# Patient Record
Sex: Male | Born: 1945 | Race: White | Hispanic: No | Marital: Single | Smoking: Former smoker
Health system: Southern US, Community
[De-identification: ages and names within clinical notes are randomized; demographics above are authoritative.]

## PROBLEM LIST (undated history)

## (undated) ENCOUNTER — Emergency Department (HOSPITAL_BASED_OUTPATIENT_CLINIC_OR_DEPARTMENT_OTHER): Admission: EM | Payer: Medicare HMO

## (undated) DIAGNOSIS — N200 Calculus of kidney: Secondary | ICD-10-CM

## (undated) DIAGNOSIS — I10 Essential (primary) hypertension: Secondary | ICD-10-CM

## (undated) DIAGNOSIS — E785 Hyperlipidemia, unspecified: Secondary | ICD-10-CM

## (undated) HISTORY — PX: EYE SURGERY: SHX253

---

## 1999-10-06 ENCOUNTER — Encounter: Admission: RE | Admit: 1999-10-06 | Discharge: 1999-10-06 | Payer: Self-pay | Admitting: Family Medicine

## 1999-10-06 ENCOUNTER — Encounter: Payer: Self-pay | Admitting: Family Medicine

## 2014-10-02 DIAGNOSIS — Z85820 Personal history of malignant melanoma of skin: Secondary | ICD-10-CM | POA: Diagnosis not present

## 2014-10-02 DIAGNOSIS — Z0000 Encounter for general adult medical examination without abnormal findings: Secondary | ICD-10-CM | POA: Diagnosis not present

## 2014-10-02 DIAGNOSIS — Z125 Encounter for screening for malignant neoplasm of prostate: Secondary | ICD-10-CM | POA: Diagnosis not present

## 2014-10-02 DIAGNOSIS — E669 Obesity, unspecified: Secondary | ICD-10-CM | POA: Diagnosis not present

## 2014-10-02 DIAGNOSIS — I10 Essential (primary) hypertension: Secondary | ICD-10-CM | POA: Diagnosis not present

## 2014-10-16 DIAGNOSIS — L570 Actinic keratosis: Secondary | ICD-10-CM | POA: Diagnosis not present

## 2014-10-16 DIAGNOSIS — Z85820 Personal history of malignant melanoma of skin: Secondary | ICD-10-CM | POA: Diagnosis not present

## 2014-10-16 DIAGNOSIS — Z08 Encounter for follow-up examination after completed treatment for malignant neoplasm: Secondary | ICD-10-CM | POA: Diagnosis not present

## 2014-12-05 DIAGNOSIS — R05 Cough: Secondary | ICD-10-CM | POA: Diagnosis not present

## 2015-02-10 DIAGNOSIS — D0421 Carcinoma in situ of skin of right ear and external auricular canal: Secondary | ICD-10-CM | POA: Diagnosis not present

## 2015-02-10 DIAGNOSIS — D0439 Carcinoma in situ of skin of other parts of face: Secondary | ICD-10-CM | POA: Diagnosis not present

## 2015-02-10 DIAGNOSIS — L570 Actinic keratosis: Secondary | ICD-10-CM | POA: Diagnosis not present

## 2015-02-10 DIAGNOSIS — Z08 Encounter for follow-up examination after completed treatment for malignant neoplasm: Secondary | ICD-10-CM | POA: Diagnosis not present

## 2015-02-10 DIAGNOSIS — Z85828 Personal history of other malignant neoplasm of skin: Secondary | ICD-10-CM | POA: Diagnosis not present

## 2015-02-26 DIAGNOSIS — K5730 Diverticulosis of large intestine without perforation or abscess without bleeding: Secondary | ICD-10-CM | POA: Diagnosis not present

## 2015-02-26 DIAGNOSIS — D122 Benign neoplasm of ascending colon: Secondary | ICD-10-CM | POA: Diagnosis not present

## 2015-02-26 DIAGNOSIS — D126 Benign neoplasm of colon, unspecified: Secondary | ICD-10-CM | POA: Diagnosis not present

## 2015-02-26 DIAGNOSIS — Z09 Encounter for follow-up examination after completed treatment for conditions other than malignant neoplasm: Secondary | ICD-10-CM | POA: Diagnosis not present

## 2015-02-26 DIAGNOSIS — Z86010 Personal history of colonic polyps: Secondary | ICD-10-CM | POA: Diagnosis not present

## 2015-02-26 DIAGNOSIS — D123 Benign neoplasm of transverse colon: Secondary | ICD-10-CM | POA: Diagnosis not present

## 2015-06-11 DIAGNOSIS — L570 Actinic keratosis: Secondary | ICD-10-CM | POA: Diagnosis not present

## 2015-06-11 DIAGNOSIS — Z08 Encounter for follow-up examination after completed treatment for malignant neoplasm: Secondary | ICD-10-CM | POA: Diagnosis not present

## 2015-06-11 DIAGNOSIS — L820 Inflamed seborrheic keratosis: Secondary | ICD-10-CM | POA: Diagnosis not present

## 2015-06-11 DIAGNOSIS — Z85828 Personal history of other malignant neoplasm of skin: Secondary | ICD-10-CM | POA: Diagnosis not present

## 2015-09-10 DIAGNOSIS — M47812 Spondylosis without myelopathy or radiculopathy, cervical region: Secondary | ICD-10-CM | POA: Diagnosis not present

## 2015-09-10 DIAGNOSIS — M4692 Unspecified inflammatory spondylopathy, cervical region: Secondary | ICD-10-CM | POA: Diagnosis not present

## 2015-09-10 DIAGNOSIS — S46812A Strain of other muscles, fascia and tendons at shoulder and upper arm level, left arm, initial encounter: Secondary | ICD-10-CM | POA: Diagnosis not present

## 2015-09-10 DIAGNOSIS — S46811A Strain of other muscles, fascia and tendons at shoulder and upper arm level, right arm, initial encounter: Secondary | ICD-10-CM | POA: Diagnosis not present

## 2015-09-10 DIAGNOSIS — M542 Cervicalgia: Secondary | ICD-10-CM | POA: Diagnosis not present

## 2015-09-21 DIAGNOSIS — I10 Essential (primary) hypertension: Secondary | ICD-10-CM | POA: Diagnosis not present

## 2015-09-21 DIAGNOSIS — S161XXA Strain of muscle, fascia and tendon at neck level, initial encounter: Secondary | ICD-10-CM | POA: Diagnosis not present

## 2015-09-30 DIAGNOSIS — M5412 Radiculopathy, cervical region: Secondary | ICD-10-CM | POA: Diagnosis not present

## 2015-10-01 DIAGNOSIS — D485 Neoplasm of uncertain behavior of skin: Secondary | ICD-10-CM | POA: Diagnosis not present

## 2015-10-01 DIAGNOSIS — L820 Inflamed seborrheic keratosis: Secondary | ICD-10-CM | POA: Diagnosis not present

## 2015-10-06 ENCOUNTER — Other Ambulatory Visit (HOSPITAL_BASED_OUTPATIENT_CLINIC_OR_DEPARTMENT_OTHER): Payer: Self-pay | Admitting: Family Medicine

## 2015-10-06 DIAGNOSIS — M5412 Radiculopathy, cervical region: Secondary | ICD-10-CM

## 2015-10-11 ENCOUNTER — Ambulatory Visit (HOSPITAL_BASED_OUTPATIENT_CLINIC_OR_DEPARTMENT_OTHER)
Admission: RE | Admit: 2015-10-11 | Discharge: 2015-10-11 | Disposition: A | Discharge status: Home / Self Care | Payer: Commercial Managed Care - HMO | Source: Ambulatory Visit | Attending: Family Medicine | Admitting: Family Medicine

## 2015-10-11 DIAGNOSIS — M4802 Spinal stenosis, cervical region: Secondary | ICD-10-CM | POA: Insufficient documentation

## 2015-10-11 DIAGNOSIS — M79601 Pain in right arm: Secondary | ICD-10-CM | POA: Insufficient documentation

## 2015-10-11 DIAGNOSIS — R531 Weakness: Secondary | ICD-10-CM | POA: Insufficient documentation

## 2015-10-11 DIAGNOSIS — M5412 Radiculopathy, cervical region: Secondary | ICD-10-CM | POA: Insufficient documentation

## 2015-10-11 DIAGNOSIS — M50322 Other cervical disc degeneration at C5-C6 level: Secondary | ICD-10-CM | POA: Insufficient documentation

## 2016-02-18 DIAGNOSIS — M25532 Pain in left wrist: Secondary | ICD-10-CM | POA: Diagnosis not present

## 2016-02-22 DIAGNOSIS — M25532 Pain in left wrist: Secondary | ICD-10-CM | POA: Diagnosis not present

## 2016-03-03 DIAGNOSIS — Z08 Encounter for follow-up examination after completed treatment for malignant neoplasm: Secondary | ICD-10-CM | POA: Diagnosis not present

## 2016-03-03 DIAGNOSIS — L570 Actinic keratosis: Secondary | ICD-10-CM | POA: Diagnosis not present

## 2016-03-03 DIAGNOSIS — Z85820 Personal history of malignant melanoma of skin: Secondary | ICD-10-CM | POA: Diagnosis not present

## 2016-08-10 DIAGNOSIS — Z23 Encounter for immunization: Secondary | ICD-10-CM | POA: Diagnosis not present

## 2016-08-10 DIAGNOSIS — Z1211 Encounter for screening for malignant neoplasm of colon: Secondary | ICD-10-CM | POA: Diagnosis not present

## 2016-08-10 DIAGNOSIS — E669 Obesity, unspecified: Secondary | ICD-10-CM | POA: Diagnosis not present

## 2016-08-10 DIAGNOSIS — Z125 Encounter for screening for malignant neoplasm of prostate: Secondary | ICD-10-CM | POA: Diagnosis not present

## 2016-08-10 DIAGNOSIS — I10 Essential (primary) hypertension: Secondary | ICD-10-CM | POA: Diagnosis not present

## 2016-09-06 DIAGNOSIS — Z08 Encounter for follow-up examination after completed treatment for malignant neoplasm: Secondary | ICD-10-CM | POA: Diagnosis not present

## 2016-09-06 DIAGNOSIS — L570 Actinic keratosis: Secondary | ICD-10-CM | POA: Diagnosis not present

## 2016-09-06 DIAGNOSIS — Z85820 Personal history of malignant melanoma of skin: Secondary | ICD-10-CM | POA: Diagnosis not present

## 2016-10-20 DIAGNOSIS — J069 Acute upper respiratory infection, unspecified: Secondary | ICD-10-CM | POA: Diagnosis not present

## 2016-10-20 DIAGNOSIS — R05 Cough: Secondary | ICD-10-CM | POA: Diagnosis not present

## 2016-12-19 DIAGNOSIS — M25552 Pain in left hip: Secondary | ICD-10-CM | POA: Diagnosis not present

## 2016-12-19 DIAGNOSIS — M5489 Other dorsalgia: Secondary | ICD-10-CM | POA: Diagnosis not present

## 2016-12-19 DIAGNOSIS — M545 Low back pain: Secondary | ICD-10-CM | POA: Diagnosis not present

## 2017-01-23 DIAGNOSIS — Z0000 Encounter for general adult medical examination without abnormal findings: Secondary | ICD-10-CM | POA: Diagnosis not present

## 2017-01-23 DIAGNOSIS — S80862A Insect bite (nonvenomous), left lower leg, initial encounter: Secondary | ICD-10-CM | POA: Diagnosis not present

## 2017-02-09 DIAGNOSIS — I10 Essential (primary) hypertension: Secondary | ICD-10-CM | POA: Diagnosis not present

## 2017-02-09 DIAGNOSIS — S80862A Insect bite (nonvenomous), left lower leg, initial encounter: Secondary | ICD-10-CM | POA: Diagnosis not present

## 2017-02-09 DIAGNOSIS — R42 Dizziness and giddiness: Secondary | ICD-10-CM | POA: Diagnosis not present

## 2017-02-17 DIAGNOSIS — H5203 Hypermetropia, bilateral: Secondary | ICD-10-CM | POA: Diagnosis not present

## 2017-03-21 DIAGNOSIS — Z08 Encounter for follow-up examination after completed treatment for malignant neoplasm: Secondary | ICD-10-CM | POA: Diagnosis not present

## 2017-03-21 DIAGNOSIS — Z85820 Personal history of malignant melanoma of skin: Secondary | ICD-10-CM | POA: Diagnosis not present

## 2017-03-21 DIAGNOSIS — L570 Actinic keratosis: Secondary | ICD-10-CM | POA: Diagnosis not present

## 2017-04-01 DIAGNOSIS — L237 Allergic contact dermatitis due to plants, except food: Secondary | ICD-10-CM | POA: Diagnosis not present

## 2017-05-05 DIAGNOSIS — H353131 Nonexudative age-related macular degeneration, bilateral, early dry stage: Secondary | ICD-10-CM | POA: Diagnosis not present

## 2017-05-05 DIAGNOSIS — H353130 Nonexudative age-related macular degeneration, bilateral, stage unspecified: Secondary | ICD-10-CM | POA: Diagnosis not present

## 2017-05-05 DIAGNOSIS — H25043 Posterior subcapsular polar age-related cataract, bilateral: Secondary | ICD-10-CM | POA: Diagnosis not present

## 2017-05-05 DIAGNOSIS — H2513 Age-related nuclear cataract, bilateral: Secondary | ICD-10-CM | POA: Diagnosis not present

## 2017-05-05 DIAGNOSIS — H25041 Posterior subcapsular polar age-related cataract, right eye: Secondary | ICD-10-CM | POA: Diagnosis not present

## 2017-05-05 DIAGNOSIS — H25011 Cortical age-related cataract, right eye: Secondary | ICD-10-CM | POA: Diagnosis not present

## 2017-05-05 DIAGNOSIS — H2511 Age-related nuclear cataract, right eye: Secondary | ICD-10-CM | POA: Diagnosis not present

## 2017-05-05 DIAGNOSIS — H25013 Cortical age-related cataract, bilateral: Secondary | ICD-10-CM | POA: Diagnosis not present

## 2017-06-19 DIAGNOSIS — H2511 Age-related nuclear cataract, right eye: Secondary | ICD-10-CM | POA: Diagnosis not present

## 2017-06-19 DIAGNOSIS — H52201 Unspecified astigmatism, right eye: Secondary | ICD-10-CM | POA: Diagnosis not present

## 2017-06-20 DIAGNOSIS — H2512 Age-related nuclear cataract, left eye: Secondary | ICD-10-CM | POA: Diagnosis not present

## 2017-07-03 DIAGNOSIS — H2512 Age-related nuclear cataract, left eye: Secondary | ICD-10-CM | POA: Diagnosis not present

## 2017-07-03 DIAGNOSIS — H52202 Unspecified astigmatism, left eye: Secondary | ICD-10-CM | POA: Diagnosis not present

## 2017-07-19 DIAGNOSIS — J0100 Acute maxillary sinusitis, unspecified: Secondary | ICD-10-CM | POA: Diagnosis not present

## 2017-07-19 DIAGNOSIS — R0981 Nasal congestion: Secondary | ICD-10-CM | POA: Diagnosis not present

## 2017-07-19 DIAGNOSIS — R05 Cough: Secondary | ICD-10-CM | POA: Diagnosis not present

## 2017-07-19 DIAGNOSIS — J069 Acute upper respiratory infection, unspecified: Secondary | ICD-10-CM | POA: Diagnosis not present

## 2017-09-06 DIAGNOSIS — R05 Cough: Secondary | ICD-10-CM | POA: Diagnosis not present

## 2017-09-19 DIAGNOSIS — L570 Actinic keratosis: Secondary | ICD-10-CM | POA: Diagnosis not present

## 2017-09-19 DIAGNOSIS — Z85820 Personal history of malignant melanoma of skin: Secondary | ICD-10-CM | POA: Diagnosis not present

## 2017-09-19 DIAGNOSIS — Z08 Encounter for follow-up examination after completed treatment for malignant neoplasm: Secondary | ICD-10-CM | POA: Diagnosis not present

## 2017-10-04 DIAGNOSIS — Z8709 Personal history of other diseases of the respiratory system: Secondary | ICD-10-CM | POA: Diagnosis not present

## 2017-10-04 DIAGNOSIS — R51 Headache: Secondary | ICD-10-CM | POA: Diagnosis not present

## 2017-10-05 ENCOUNTER — Other Ambulatory Visit: Payer: Self-pay | Admitting: Family Medicine

## 2017-10-05 DIAGNOSIS — R519 Headache, unspecified: Secondary | ICD-10-CM

## 2017-10-05 DIAGNOSIS — R51 Headache: Principal | ICD-10-CM

## 2017-10-18 ENCOUNTER — Other Ambulatory Visit: Payer: Self-pay

## 2017-10-18 ENCOUNTER — Ambulatory Visit
Admission: RE | Admit: 2017-10-18 | Discharge: 2017-10-18 | Disposition: A | Discharge status: Home / Self Care | Payer: Medicare HMO | Source: Ambulatory Visit | Attending: Family Medicine | Admitting: Family Medicine

## 2017-10-18 DIAGNOSIS — R51 Headache: Principal | ICD-10-CM

## 2017-10-18 DIAGNOSIS — R519 Headache, unspecified: Secondary | ICD-10-CM

## 2017-11-27 DIAGNOSIS — I10 Essential (primary) hypertension: Secondary | ICD-10-CM | POA: Diagnosis not present

## 2017-11-27 DIAGNOSIS — I7090 Unspecified atherosclerosis: Secondary | ICD-10-CM | POA: Diagnosis not present

## 2017-11-27 DIAGNOSIS — E782 Mixed hyperlipidemia: Secondary | ICD-10-CM | POA: Diagnosis not present

## 2017-12-12 ENCOUNTER — Other Ambulatory Visit: Payer: Self-pay | Admitting: Family Medicine

## 2017-12-12 DIAGNOSIS — I7090 Unspecified atherosclerosis: Secondary | ICD-10-CM

## 2017-12-15 ENCOUNTER — Ambulatory Visit
Admission: RE | Admit: 2017-12-15 | Discharge: 2017-12-15 | Disposition: A | Discharge status: Home / Self Care | Payer: Medicare HMO | Source: Ambulatory Visit | Attending: Family Medicine | Admitting: Family Medicine

## 2017-12-15 DIAGNOSIS — I7090 Unspecified atherosclerosis: Secondary | ICD-10-CM

## 2017-12-15 DIAGNOSIS — I6523 Occlusion and stenosis of bilateral carotid arteries: Secondary | ICD-10-CM | POA: Diagnosis not present

## 2017-12-18 DIAGNOSIS — E782 Mixed hyperlipidemia: Secondary | ICD-10-CM | POA: Diagnosis not present

## 2017-12-18 DIAGNOSIS — I10 Essential (primary) hypertension: Secondary | ICD-10-CM | POA: Diagnosis not present

## 2018-02-06 DIAGNOSIS — E782 Mixed hyperlipidemia: Secondary | ICD-10-CM | POA: Diagnosis not present

## 2018-02-13 DIAGNOSIS — Z961 Presence of intraocular lens: Secondary | ICD-10-CM | POA: Diagnosis not present

## 2018-02-13 DIAGNOSIS — H18413 Arcus senilis, bilateral: Secondary | ICD-10-CM | POA: Diagnosis not present

## 2018-02-13 DIAGNOSIS — H353130 Nonexudative age-related macular degeneration, bilateral, stage unspecified: Secondary | ICD-10-CM | POA: Diagnosis not present

## 2018-02-13 DIAGNOSIS — H02839 Dermatochalasis of unspecified eye, unspecified eyelid: Secondary | ICD-10-CM | POA: Diagnosis not present

## 2018-03-22 DIAGNOSIS — L821 Other seborrheic keratosis: Secondary | ICD-10-CM | POA: Diagnosis not present

## 2018-03-22 DIAGNOSIS — Z86008 Personal history of in-situ neoplasm of other site: Secondary | ICD-10-CM | POA: Diagnosis not present

## 2018-03-22 DIAGNOSIS — L570 Actinic keratosis: Secondary | ICD-10-CM | POA: Diagnosis not present

## 2018-03-22 DIAGNOSIS — Z85820 Personal history of malignant melanoma of skin: Secondary | ICD-10-CM | POA: Diagnosis not present

## 2018-03-22 DIAGNOSIS — Z08 Encounter for follow-up examination after completed treatment for malignant neoplasm: Secondary | ICD-10-CM | POA: Diagnosis not present

## 2018-05-26 IMAGING — US US CAROTID DUPLEX BILAT
1 series · 13 of 24 positions shown · non-contrast
Comparison: None.

CLINICAL DATA: 71-year-old male with carotid atherosclerosis

EXAM:
BILATERAL CAROTID DUPLEX ULTRASOUND
TECHNIQUE: Gray scale imaging, color Doppler and duplex ultrasound were
performed of bilateral carotid and vertebral arteries in the neck.

[Series 1: us carotid duplex bilat · 0.06mm/px · 13 of 66 slices shown]
[im 1/66]
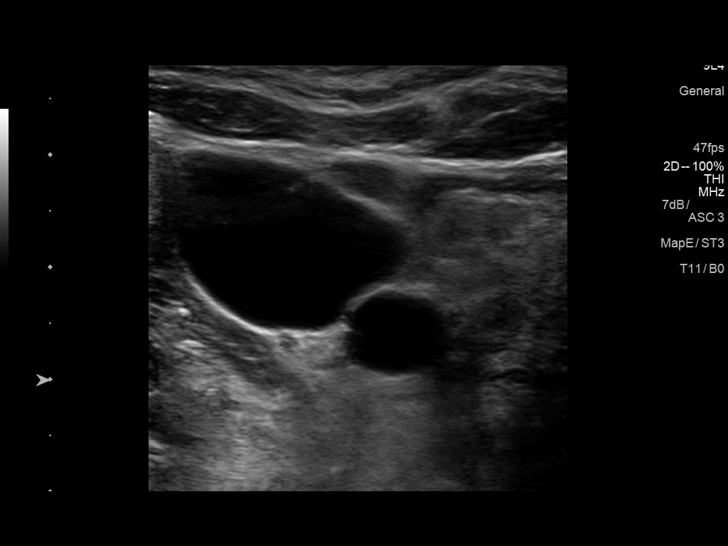
[im 6/66]
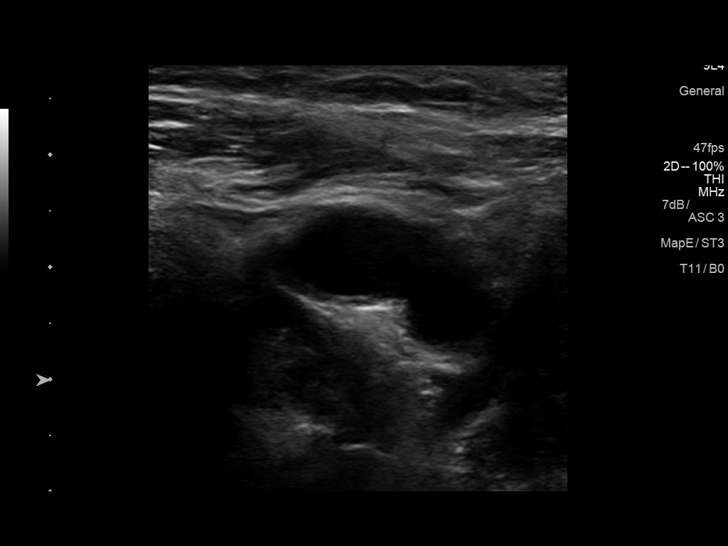
[im 12/66]
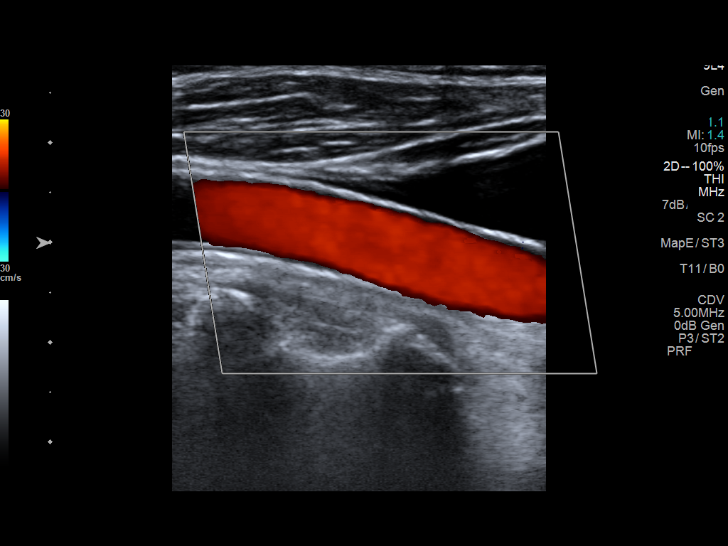
[im 17/66]
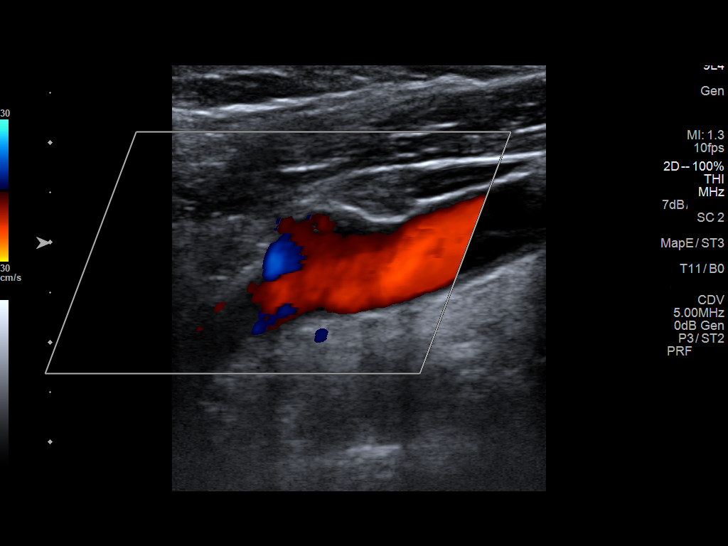
[im 23/66]
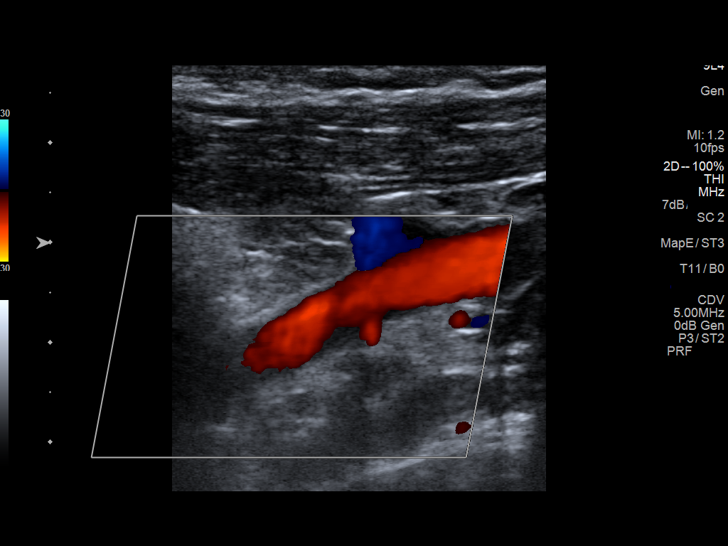
[im 29/66]
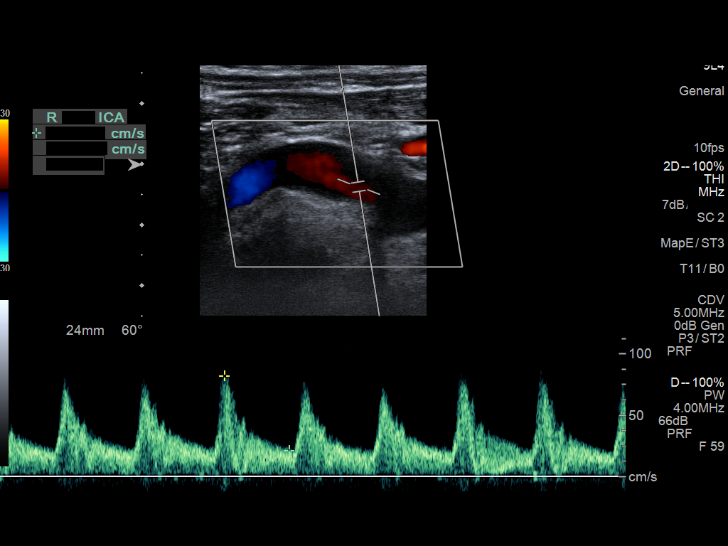
[im 34/66]
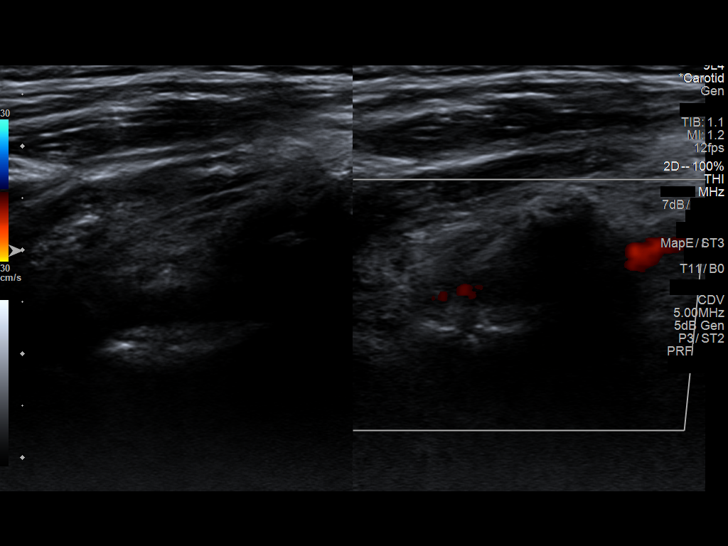
[im 37/66]
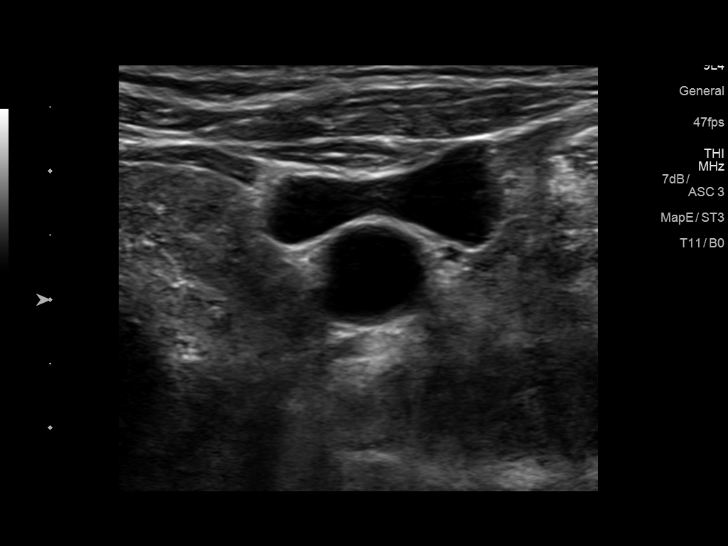
[im 43/66]
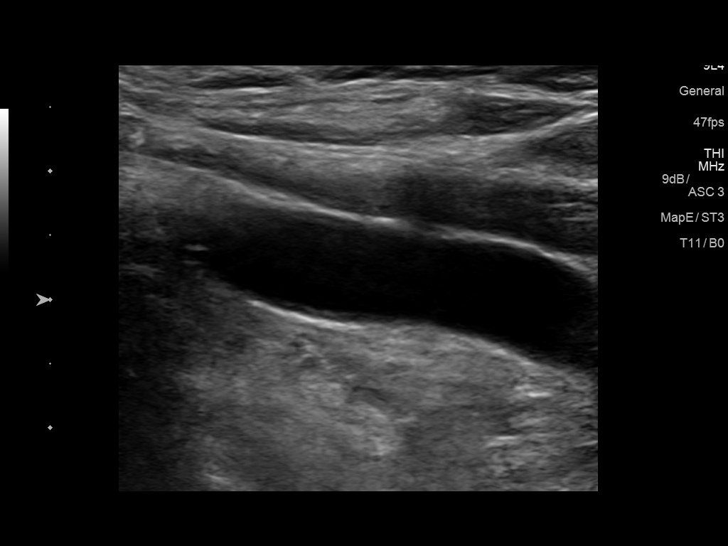
[im 49/66]
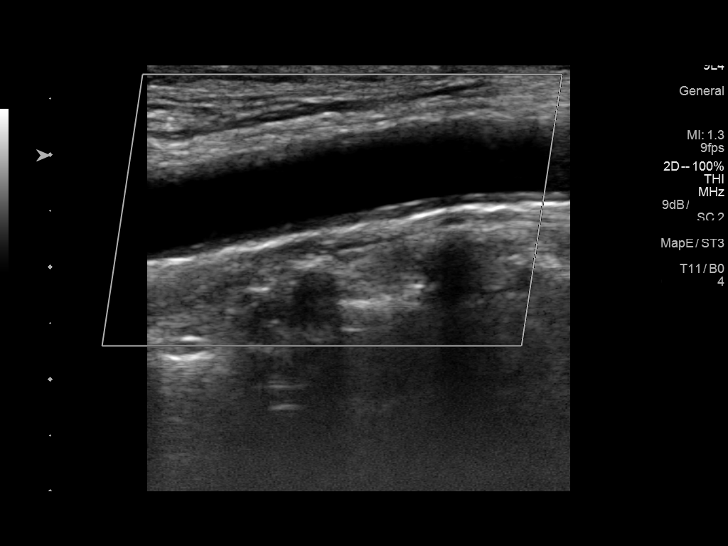
[im 54/66]
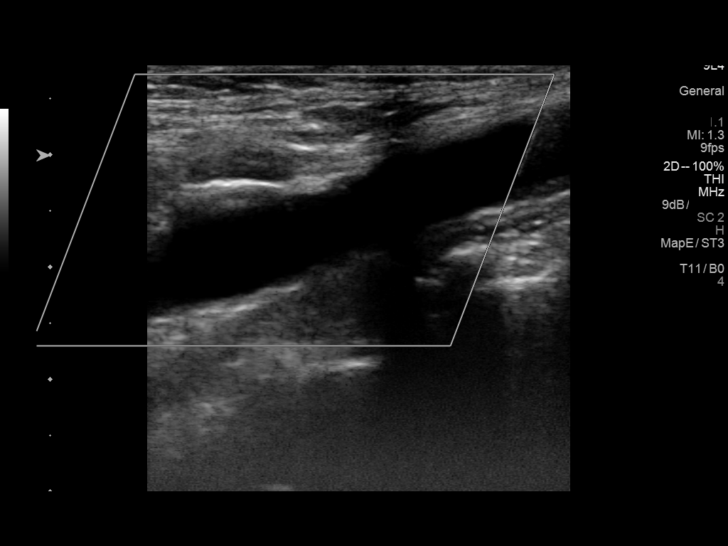
[im 60/66]
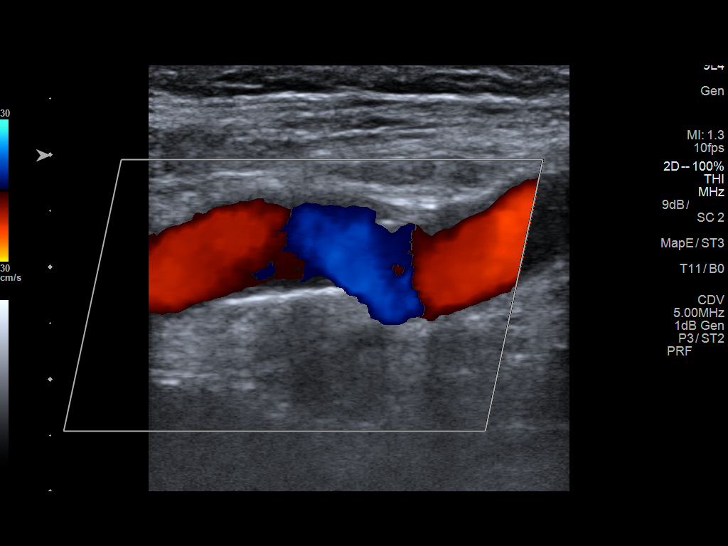
[im 66/66]
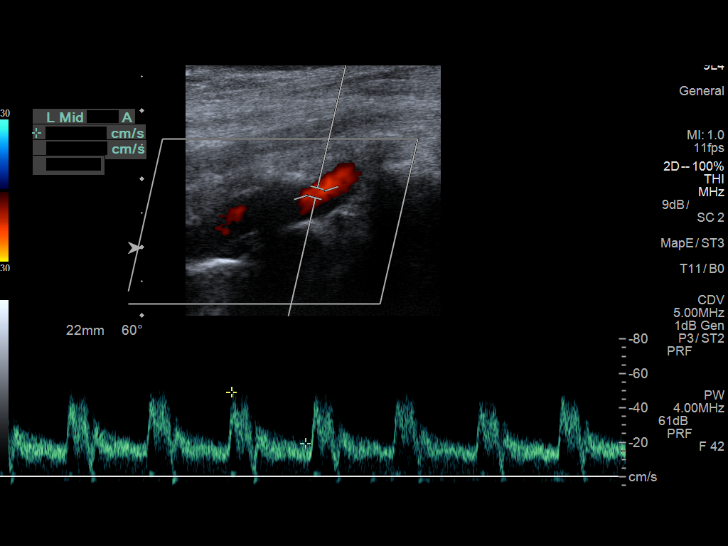

[13 of 24 positions shown; findings below may reference images not displayed]

FINDINGS: Criteria: Quantification of carotid stenosis is based on velocity
parameters that correlate the residual internal carotid diameter
with NASCET-based stenosis levels, using the diameter of the distal
internal carotid lumen as the denominator for stenosis measurement.

The following velocity measurements were obtained:

RIGHT
ICA: 82/21 cm/sec
CCA: 153/25 cm/sec

SYSTOLIC ICA/CCA RATIO:

ECA:  88 cm/sec

LEFT

ICA: 88/24 cm/sec

CCA: 132/17 cm/sec

SYSTOLIC ICA/CCA RATIO:

ECA:  61 cm/sec

RIGHT CAROTID ARTERY: Small volume hypoechoic plaque in the proximal
internal carotid artery. By peak systolic velocity criteria, the
estimated stenosis remains less than 50%.

RIGHT VERTEBRAL ARTERY:  Patent with normal antegrade flow.

LEFT CAROTID ARTERY: Mild hypoechoic atherosclerotic plaque in the
proximal internal carotid artery. By peak systolic velocity
criteria, the estimated stenosis remains less than 50%.

LEFT VERTEBRAL ARTERY:  Patent with normal antegrade flow.
IMPRESSION: 1. Mild (1-49%) stenosis proximal right internal carotid artery
secondary to hypoechoic (lipid rich) atherosclerotic plaque.
2. Mild (1-49%) stenosis proximal left internal carotid artery
secondary to hypoechoic (lipid rich) atherosclerotic plaque.
3. Vertebral arteries are patent with normal antegrade flow.

## 2018-06-08 DIAGNOSIS — Z125 Encounter for screening for malignant neoplasm of prostate: Secondary | ICD-10-CM | POA: Diagnosis not present

## 2018-06-08 DIAGNOSIS — I7090 Unspecified atherosclerosis: Secondary | ICD-10-CM | POA: Diagnosis not present

## 2018-06-08 DIAGNOSIS — Z0000 Encounter for general adult medical examination without abnormal findings: Secondary | ICD-10-CM | POA: Diagnosis not present

## 2018-06-08 DIAGNOSIS — E782 Mixed hyperlipidemia: Secondary | ICD-10-CM | POA: Diagnosis not present

## 2018-06-08 DIAGNOSIS — Z23 Encounter for immunization: Secondary | ICD-10-CM | POA: Diagnosis not present

## 2018-06-21 DIAGNOSIS — Z0000 Encounter for general adult medical examination without abnormal findings: Secondary | ICD-10-CM | POA: Diagnosis not present

## 2018-06-21 DIAGNOSIS — Z125 Encounter for screening for malignant neoplasm of prostate: Secondary | ICD-10-CM | POA: Diagnosis not present

## 2018-06-21 DIAGNOSIS — L570 Actinic keratosis: Secondary | ICD-10-CM | POA: Diagnosis not present

## 2018-06-21 DIAGNOSIS — E782 Mixed hyperlipidemia: Secondary | ICD-10-CM | POA: Diagnosis not present

## 2018-06-21 DIAGNOSIS — L989 Disorder of the skin and subcutaneous tissue, unspecified: Secondary | ICD-10-CM | POA: Diagnosis not present

## 2018-07-02 DIAGNOSIS — Z23 Encounter for immunization: Secondary | ICD-10-CM | POA: Diagnosis not present

## 2018-08-27 DIAGNOSIS — E782 Mixed hyperlipidemia: Secondary | ICD-10-CM | POA: Diagnosis not present

## 2018-08-27 DIAGNOSIS — I10 Essential (primary) hypertension: Secondary | ICD-10-CM | POA: Diagnosis not present

## 2018-09-10 DIAGNOSIS — R05 Cough: Secondary | ICD-10-CM | POA: Diagnosis not present

## 2018-09-10 DIAGNOSIS — J069 Acute upper respiratory infection, unspecified: Secondary | ICD-10-CM | POA: Diagnosis not present

## 2018-09-10 DIAGNOSIS — I10 Essential (primary) hypertension: Secondary | ICD-10-CM | POA: Diagnosis not present

## 2018-09-11 DIAGNOSIS — R05 Cough: Secondary | ICD-10-CM | POA: Diagnosis not present

## 2018-09-27 DIAGNOSIS — Z85820 Personal history of malignant melanoma of skin: Secondary | ICD-10-CM | POA: Diagnosis not present

## 2018-09-27 DIAGNOSIS — L570 Actinic keratosis: Secondary | ICD-10-CM | POA: Diagnosis not present

## 2018-09-27 DIAGNOSIS — L821 Other seborrheic keratosis: Secondary | ICD-10-CM | POA: Diagnosis not present

## 2018-09-27 DIAGNOSIS — Z08 Encounter for follow-up examination after completed treatment for malignant neoplasm: Secondary | ICD-10-CM | POA: Diagnosis not present

## 2019-02-19 DIAGNOSIS — H18413 Arcus senilis, bilateral: Secondary | ICD-10-CM | POA: Diagnosis not present

## 2019-02-19 DIAGNOSIS — H353131 Nonexudative age-related macular degeneration, bilateral, early dry stage: Secondary | ICD-10-CM | POA: Diagnosis not present

## 2019-02-19 DIAGNOSIS — H02839 Dermatochalasis of unspecified eye, unspecified eyelid: Secondary | ICD-10-CM | POA: Diagnosis not present

## 2019-02-19 DIAGNOSIS — Z961 Presence of intraocular lens: Secondary | ICD-10-CM | POA: Diagnosis not present

## 2019-02-22 DIAGNOSIS — K219 Gastro-esophageal reflux disease without esophagitis: Secondary | ICD-10-CM | POA: Diagnosis not present

## 2019-02-22 DIAGNOSIS — E785 Hyperlipidemia, unspecified: Secondary | ICD-10-CM | POA: Diagnosis not present

## 2019-02-22 DIAGNOSIS — Z1159 Encounter for screening for other viral diseases: Secondary | ICD-10-CM | POA: Diagnosis not present

## 2019-02-22 DIAGNOSIS — Z7982 Long term (current) use of aspirin: Secondary | ICD-10-CM | POA: Diagnosis not present

## 2019-02-22 DIAGNOSIS — K5730 Diverticulosis of large intestine without perforation or abscess without bleeding: Secondary | ICD-10-CM | POA: Diagnosis not present

## 2019-02-22 DIAGNOSIS — Z79899 Other long term (current) drug therapy: Secondary | ICD-10-CM | POA: Diagnosis not present

## 2019-02-22 DIAGNOSIS — N132 Hydronephrosis with renal and ureteral calculous obstruction: Secondary | ICD-10-CM | POA: Diagnosis not present

## 2019-02-22 DIAGNOSIS — N201 Calculus of ureter: Secondary | ICD-10-CM | POA: Diagnosis not present

## 2019-02-22 DIAGNOSIS — Z466 Encounter for fitting and adjustment of urinary device: Secondary | ICD-10-CM | POA: Diagnosis not present

## 2019-02-22 DIAGNOSIS — G47419 Narcolepsy without cataplexy: Secondary | ICD-10-CM | POA: Diagnosis not present

## 2019-02-22 DIAGNOSIS — N202 Calculus of kidney with calculus of ureter: Secondary | ICD-10-CM | POA: Diagnosis not present

## 2019-02-22 DIAGNOSIS — I10 Essential (primary) hypertension: Secondary | ICD-10-CM | POA: Diagnosis not present

## 2019-02-22 DIAGNOSIS — R1032 Left lower quadrant pain: Secondary | ICD-10-CM | POA: Diagnosis not present

## 2019-02-22 DIAGNOSIS — Z87891 Personal history of nicotine dependence: Secondary | ICD-10-CM | POA: Diagnosis not present

## 2019-02-25 DIAGNOSIS — N200 Calculus of kidney: Secondary | ICD-10-CM | POA: Diagnosis not present

## 2019-02-25 DIAGNOSIS — E782 Mixed hyperlipidemia: Secondary | ICD-10-CM | POA: Diagnosis not present

## 2019-02-25 DIAGNOSIS — I10 Essential (primary) hypertension: Secondary | ICD-10-CM | POA: Diagnosis not present

## 2019-03-23 DIAGNOSIS — Z1159 Encounter for screening for other viral diseases: Secondary | ICD-10-CM | POA: Diagnosis not present

## 2019-03-23 DIAGNOSIS — N209 Urinary calculus, unspecified: Secondary | ICD-10-CM | POA: Diagnosis not present

## 2019-03-23 DIAGNOSIS — Z01812 Encounter for preprocedural laboratory examination: Secondary | ICD-10-CM | POA: Diagnosis not present

## 2019-03-27 DIAGNOSIS — N200 Calculus of kidney: Secondary | ICD-10-CM | POA: Diagnosis not present

## 2019-03-27 DIAGNOSIS — Z79899 Other long term (current) drug therapy: Secondary | ICD-10-CM | POA: Diagnosis not present

## 2019-03-27 DIAGNOSIS — Z7982 Long term (current) use of aspirin: Secondary | ICD-10-CM | POA: Diagnosis not present

## 2019-03-27 DIAGNOSIS — I10 Essential (primary) hypertension: Secondary | ICD-10-CM | POA: Diagnosis not present

## 2019-03-27 DIAGNOSIS — N132 Hydronephrosis with renal and ureteral calculous obstruction: Secondary | ICD-10-CM | POA: Diagnosis not present

## 2019-03-27 DIAGNOSIS — E785 Hyperlipidemia, unspecified: Secondary | ICD-10-CM | POA: Diagnosis not present

## 2019-03-27 DIAGNOSIS — F17220 Nicotine dependence, chewing tobacco, uncomplicated: Secondary | ICD-10-CM | POA: Diagnosis not present

## 2019-03-27 DIAGNOSIS — K219 Gastro-esophageal reflux disease without esophagitis: Secondary | ICD-10-CM | POA: Diagnosis not present

## 2019-04-09 DIAGNOSIS — Z85820 Personal history of malignant melanoma of skin: Secondary | ICD-10-CM | POA: Diagnosis not present

## 2019-04-09 DIAGNOSIS — L821 Other seborrheic keratosis: Secondary | ICD-10-CM | POA: Diagnosis not present

## 2019-04-09 DIAGNOSIS — L570 Actinic keratosis: Secondary | ICD-10-CM | POA: Diagnosis not present

## 2019-04-09 DIAGNOSIS — Z08 Encounter for follow-up examination after completed treatment for malignant neoplasm: Secondary | ICD-10-CM | POA: Diagnosis not present

## 2019-04-15 DIAGNOSIS — N200 Calculus of kidney: Secondary | ICD-10-CM | POA: Diagnosis not present

## 2019-04-17 DIAGNOSIS — N390 Urinary tract infection, site not specified: Secondary | ICD-10-CM | POA: Diagnosis not present

## 2019-04-17 DIAGNOSIS — B9689 Other specified bacterial agents as the cause of diseases classified elsewhere: Secondary | ICD-10-CM | POA: Diagnosis not present

## 2019-04-17 DIAGNOSIS — Z87891 Personal history of nicotine dependence: Secondary | ICD-10-CM | POA: Diagnosis not present

## 2019-04-17 DIAGNOSIS — I10 Essential (primary) hypertension: Secondary | ICD-10-CM | POA: Diagnosis not present

## 2019-04-17 DIAGNOSIS — R319 Hematuria, unspecified: Secondary | ICD-10-CM | POA: Diagnosis not present

## 2019-04-17 DIAGNOSIS — Z79899 Other long term (current) drug therapy: Secondary | ICD-10-CM | POA: Diagnosis not present

## 2019-04-20 DIAGNOSIS — N200 Calculus of kidney: Secondary | ICD-10-CM | POA: Diagnosis not present

## 2019-04-20 DIAGNOSIS — Z87891 Personal history of nicotine dependence: Secondary | ICD-10-CM | POA: Diagnosis not present

## 2019-04-20 DIAGNOSIS — K5730 Diverticulosis of large intestine without perforation or abscess without bleeding: Secondary | ICD-10-CM | POA: Diagnosis not present

## 2019-04-20 DIAGNOSIS — N3001 Acute cystitis with hematuria: Secondary | ICD-10-CM | POA: Diagnosis not present

## 2019-04-20 DIAGNOSIS — B009 Herpesviral infection, unspecified: Secondary | ICD-10-CM | POA: Diagnosis not present

## 2019-04-20 DIAGNOSIS — N390 Urinary tract infection, site not specified: Secondary | ICD-10-CM | POA: Diagnosis not present

## 2019-04-22 DIAGNOSIS — Z09 Encounter for follow-up examination after completed treatment for conditions other than malignant neoplasm: Secondary | ICD-10-CM | POA: Diagnosis not present

## 2019-04-22 DIAGNOSIS — N281 Cyst of kidney, acquired: Secondary | ICD-10-CM | POA: Diagnosis not present

## 2019-04-22 DIAGNOSIS — N3001 Acute cystitis with hematuria: Secondary | ICD-10-CM | POA: Diagnosis not present

## 2019-05-01 DIAGNOSIS — Z23 Encounter for immunization: Secondary | ICD-10-CM | POA: Diagnosis not present

## 2019-05-01 DIAGNOSIS — R350 Frequency of micturition: Secondary | ICD-10-CM | POA: Diagnosis not present

## 2019-05-13 DIAGNOSIS — K862 Cyst of pancreas: Secondary | ICD-10-CM | POA: Diagnosis not present

## 2019-05-13 DIAGNOSIS — N281 Cyst of kidney, acquired: Secondary | ICD-10-CM | POA: Diagnosis not present

## 2019-05-13 DIAGNOSIS — N289 Disorder of kidney and ureter, unspecified: Secondary | ICD-10-CM | POA: Diagnosis not present

## 2019-08-28 DIAGNOSIS — I10 Essential (primary) hypertension: Secondary | ICD-10-CM | POA: Diagnosis not present

## 2019-08-28 DIAGNOSIS — Z23 Encounter for immunization: Secondary | ICD-10-CM | POA: Diagnosis not present

## 2019-08-28 DIAGNOSIS — I7090 Unspecified atherosclerosis: Secondary | ICD-10-CM | POA: Diagnosis not present

## 2019-08-28 DIAGNOSIS — E782 Mixed hyperlipidemia: Secondary | ICD-10-CM | POA: Diagnosis not present

## 2019-08-28 DIAGNOSIS — K862 Cyst of pancreas: Secondary | ICD-10-CM | POA: Diagnosis not present

## 2019-08-28 DIAGNOSIS — Z1211 Encounter for screening for malignant neoplasm of colon: Secondary | ICD-10-CM | POA: Diagnosis not present

## 2019-08-28 DIAGNOSIS — Z0000 Encounter for general adult medical examination without abnormal findings: Secondary | ICD-10-CM | POA: Diagnosis not present

## 2019-08-28 DIAGNOSIS — Z125 Encounter for screening for malignant neoplasm of prostate: Secondary | ICD-10-CM | POA: Diagnosis not present

## 2019-08-28 DIAGNOSIS — N200 Calculus of kidney: Secondary | ICD-10-CM | POA: Diagnosis not present

## 2019-09-14 ENCOUNTER — Ambulatory Visit: Payer: Medicare HMO | Attending: Internal Medicine

## 2019-09-14 DIAGNOSIS — Z23 Encounter for immunization: Secondary | ICD-10-CM | POA: Insufficient documentation

## 2019-09-14 NOTE — Progress Notes (Signed)
   Covid-19 Vaccination Clinic  Name:  Drew Downs    MRN: [id number] DOB: [birth year]  09/14/2019  [name] was observed post Covid-19 immunization for 15 minutes without incidence. He was provided with Vaccine Information Sheet and instruction to access the V-Safe system.   [name] was instructed to call 911 with any severe reactions post vaccine: Marland Kitchen Difficulty breathing  . Swelling of your face and throat  . A fast heartbeat  . A bad rash all over your body  . Dizziness and weakness    Immunizations Administered    Name Date Dose VIS Date Route   Pfizer COVID-19 Vaccine 09/14/2019 11:31 AM 0.3 mL 08/02/2019 Intramuscular   Manufacturer: [place]   Lot: [id number]   [place]: [id number]

## 2019-10-05 ENCOUNTER — Ambulatory Visit: Payer: Medicare HMO | Attending: Internal Medicine

## 2019-10-05 DIAGNOSIS — Z23 Encounter for immunization: Secondary | ICD-10-CM | POA: Insufficient documentation

## 2019-10-05 NOTE — Progress Notes (Signed)
   Covid-19 Vaccination Clinic  Name:  Drew Downs    MRN: [id number] DOB: 08/06/1946  10/05/2019  [name] was observed post Covid-19 immunization for 15 minutes without incidence. He was provided with Vaccine Information Sheet and instruction to access the V-Safe system.   [name] was instructed to call 911 with any severe reactions post vaccine: Marland Kitchen Difficulty breathing  . Swelling of your face and throat  . A fast heartbeat  . A bad rash all over your body  . Dizziness and weakness    Immunizations Administered    Name Date Dose VIS Date Route   Pfizer COVID-19 Vaccine 10/05/2019  9:45 AM 0.3 mL 08/02/2019 Intramuscular   Manufacturer: [place]   Lot: [id number]   [place]: [id number]

## 2019-10-31 DIAGNOSIS — D1801 Hemangioma of skin and subcutaneous tissue: Secondary | ICD-10-CM | POA: Diagnosis not present

## 2019-10-31 DIAGNOSIS — L821 Other seborrheic keratosis: Secondary | ICD-10-CM | POA: Diagnosis not present

## 2019-10-31 DIAGNOSIS — Z85820 Personal history of malignant melanoma of skin: Secondary | ICD-10-CM | POA: Diagnosis not present

## 2019-10-31 DIAGNOSIS — L570 Actinic keratosis: Secondary | ICD-10-CM | POA: Diagnosis not present

## 2019-12-10 DIAGNOSIS — M25561 Pain in right knee: Secondary | ICD-10-CM | POA: Diagnosis not present

## 2020-02-03 DIAGNOSIS — M545 Low back pain: Secondary | ICD-10-CM | POA: Diagnosis not present

## 2020-02-25 DIAGNOSIS — I10 Essential (primary) hypertension: Secondary | ICD-10-CM | POA: Diagnosis not present

## 2020-02-25 DIAGNOSIS — Z125 Encounter for screening for malignant neoplasm of prostate: Secondary | ICD-10-CM | POA: Diagnosis not present

## 2020-02-25 DIAGNOSIS — E782 Mixed hyperlipidemia: Secondary | ICD-10-CM | POA: Diagnosis not present

## 2020-02-25 DIAGNOSIS — R351 Nocturia: Secondary | ICD-10-CM | POA: Diagnosis not present

## 2020-02-28 DIAGNOSIS — Z961 Presence of intraocular lens: Secondary | ICD-10-CM | POA: Diagnosis not present

## 2020-02-28 DIAGNOSIS — H18413 Arcus senilis, bilateral: Secondary | ICD-10-CM | POA: Diagnosis not present

## 2020-02-28 DIAGNOSIS — H02831 Dermatochalasis of right upper eyelid: Secondary | ICD-10-CM | POA: Diagnosis not present

## 2020-02-28 DIAGNOSIS — H353131 Nonexudative age-related macular degeneration, bilateral, early dry stage: Secondary | ICD-10-CM | POA: Diagnosis not present

## 2020-04-15 DIAGNOSIS — D485 Neoplasm of uncertain behavior of skin: Secondary | ICD-10-CM | POA: Diagnosis not present

## 2020-04-15 DIAGNOSIS — C44311 Basal cell carcinoma of skin of nose: Secondary | ICD-10-CM | POA: Diagnosis not present

## 2020-04-28 DIAGNOSIS — R972 Elevated prostate specific antigen [PSA]: Secondary | ICD-10-CM | POA: Diagnosis not present

## 2020-04-28 DIAGNOSIS — Z1211 Encounter for screening for malignant neoplasm of colon: Secondary | ICD-10-CM | POA: Diagnosis not present

## 2020-04-28 DIAGNOSIS — Z23 Encounter for immunization: Secondary | ICD-10-CM | POA: Diagnosis not present

## 2020-05-05 DIAGNOSIS — D485 Neoplasm of uncertain behavior of skin: Secondary | ICD-10-CM | POA: Diagnosis not present

## 2020-05-05 DIAGNOSIS — L570 Actinic keratosis: Secondary | ICD-10-CM | POA: Diagnosis not present

## 2020-05-05 DIAGNOSIS — L578 Other skin changes due to chronic exposure to nonionizing radiation: Secondary | ICD-10-CM | POA: Diagnosis not present

## 2020-05-05 DIAGNOSIS — D1801 Hemangioma of skin and subcutaneous tissue: Secondary | ICD-10-CM | POA: Diagnosis not present

## 2020-05-05 DIAGNOSIS — B351 Tinea unguium: Secondary | ICD-10-CM | POA: Diagnosis not present

## 2020-05-05 DIAGNOSIS — L821 Other seborrheic keratosis: Secondary | ICD-10-CM | POA: Diagnosis not present

## 2020-05-05 DIAGNOSIS — C44311 Basal cell carcinoma of skin of nose: Secondary | ICD-10-CM | POA: Diagnosis not present

## 2020-05-05 DIAGNOSIS — Z85820 Personal history of malignant melanoma of skin: Secondary | ICD-10-CM | POA: Diagnosis not present

## 2020-05-14 DIAGNOSIS — C44311 Basal cell carcinoma of skin of nose: Secondary | ICD-10-CM | POA: Diagnosis not present

## 2020-05-20 DIAGNOSIS — L905 Scar conditions and fibrosis of skin: Secondary | ICD-10-CM | POA: Diagnosis not present

## 2020-07-22 DIAGNOSIS — L905 Scar conditions and fibrosis of skin: Secondary | ICD-10-CM | POA: Diagnosis not present

## 2020-07-29 DIAGNOSIS — Z1159 Encounter for screening for other viral diseases: Secondary | ICD-10-CM | POA: Diagnosis not present

## 2020-08-03 DIAGNOSIS — D123 Benign neoplasm of transverse colon: Secondary | ICD-10-CM | POA: Diagnosis not present

## 2020-08-03 DIAGNOSIS — K5730 Diverticulosis of large intestine without perforation or abscess without bleeding: Secondary | ICD-10-CM | POA: Diagnosis not present

## 2020-08-03 DIAGNOSIS — Z86010 Personal history of colonic polyps: Secondary | ICD-10-CM | POA: Diagnosis not present

## 2020-08-06 DIAGNOSIS — D123 Benign neoplasm of transverse colon: Secondary | ICD-10-CM | POA: Diagnosis not present

## 2020-09-15 DIAGNOSIS — I10 Essential (primary) hypertension: Secondary | ICD-10-CM | POA: Diagnosis not present

## 2020-09-15 DIAGNOSIS — Z0000 Encounter for general adult medical examination without abnormal findings: Secondary | ICD-10-CM | POA: Diagnosis not present

## 2020-09-15 DIAGNOSIS — Z125 Encounter for screening for malignant neoplasm of prostate: Secondary | ICD-10-CM | POA: Diagnosis not present

## 2020-09-15 DIAGNOSIS — E782 Mixed hyperlipidemia: Secondary | ICD-10-CM | POA: Diagnosis not present

## 2020-10-15 DIAGNOSIS — L820 Inflamed seborrheic keratosis: Secondary | ICD-10-CM | POA: Diagnosis not present

## 2020-10-15 DIAGNOSIS — Z85828 Personal history of other malignant neoplasm of skin: Secondary | ICD-10-CM | POA: Diagnosis not present

## 2020-10-15 DIAGNOSIS — L821 Other seborrheic keratosis: Secondary | ICD-10-CM | POA: Diagnosis not present

## 2020-10-15 DIAGNOSIS — Z85820 Personal history of malignant melanoma of skin: Secondary | ICD-10-CM | POA: Diagnosis not present

## 2020-10-15 DIAGNOSIS — L570 Actinic keratosis: Secondary | ICD-10-CM | POA: Diagnosis not present

## 2021-02-11 DIAGNOSIS — I10 Essential (primary) hypertension: Secondary | ICD-10-CM | POA: Diagnosis not present

## 2021-02-11 DIAGNOSIS — E782 Mixed hyperlipidemia: Secondary | ICD-10-CM | POA: Diagnosis not present

## 2021-03-02 DIAGNOSIS — H02831 Dermatochalasis of right upper eyelid: Secondary | ICD-10-CM | POA: Diagnosis not present

## 2021-03-02 DIAGNOSIS — Z961 Presence of intraocular lens: Secondary | ICD-10-CM | POA: Diagnosis not present

## 2021-03-02 DIAGNOSIS — H18413 Arcus senilis, bilateral: Secondary | ICD-10-CM | POA: Diagnosis not present

## 2021-03-02 DIAGNOSIS — H353131 Nonexudative age-related macular degeneration, bilateral, early dry stage: Secondary | ICD-10-CM | POA: Diagnosis not present

## 2021-04-27 DIAGNOSIS — L821 Other seborrheic keratosis: Secondary | ICD-10-CM | POA: Diagnosis not present

## 2021-04-27 DIAGNOSIS — D225 Melanocytic nevi of trunk: Secondary | ICD-10-CM | POA: Diagnosis not present

## 2021-04-27 DIAGNOSIS — Z85820 Personal history of malignant melanoma of skin: Secondary | ICD-10-CM | POA: Diagnosis not present

## 2021-04-27 DIAGNOSIS — D2371 Other benign neoplasm of skin of right lower limb, including hip: Secondary | ICD-10-CM | POA: Diagnosis not present

## 2021-04-27 DIAGNOSIS — D1801 Hemangioma of skin and subcutaneous tissue: Secondary | ICD-10-CM | POA: Diagnosis not present

## 2021-04-27 DIAGNOSIS — Z86008 Personal history of in-situ neoplasm of other site: Secondary | ICD-10-CM | POA: Diagnosis not present

## 2021-04-27 DIAGNOSIS — D692 Other nonthrombocytopenic purpura: Secondary | ICD-10-CM | POA: Diagnosis not present

## 2021-04-27 DIAGNOSIS — Z85828 Personal history of other malignant neoplasm of skin: Secondary | ICD-10-CM | POA: Diagnosis not present

## 2021-04-27 DIAGNOSIS — L570 Actinic keratosis: Secondary | ICD-10-CM | POA: Diagnosis not present

## 2021-05-03 ENCOUNTER — Emergency Department (HOSPITAL_BASED_OUTPATIENT_CLINIC_OR_DEPARTMENT_OTHER)
Admission: EM | Admit: 2021-05-03 | Discharge: 2021-05-03 | Disposition: A | Discharge status: Home / Self Care | Payer: Medicare HMO | Attending: Emergency Medicine | Admitting: Emergency Medicine

## 2021-05-03 ENCOUNTER — Encounter (HOSPITAL_BASED_OUTPATIENT_CLINIC_OR_DEPARTMENT_OTHER): Payer: Self-pay | Admitting: *Deleted

## 2021-05-03 ENCOUNTER — Other Ambulatory Visit (HOSPITAL_BASED_OUTPATIENT_CLINIC_OR_DEPARTMENT_OTHER): Payer: Self-pay

## 2021-05-03 ENCOUNTER — Other Ambulatory Visit: Payer: Self-pay

## 2021-05-03 DIAGNOSIS — Z87891 Personal history of nicotine dependence: Secondary | ICD-10-CM | POA: Diagnosis not present

## 2021-05-03 DIAGNOSIS — R509 Fever, unspecified: Secondary | ICD-10-CM | POA: Diagnosis present

## 2021-05-03 DIAGNOSIS — I10 Essential (primary) hypertension: Secondary | ICD-10-CM | POA: Diagnosis not present

## 2021-05-03 DIAGNOSIS — Z20822 Contact with and (suspected) exposure to covid-19: Secondary | ICD-10-CM

## 2021-05-03 DIAGNOSIS — U071 COVID-19: Secondary | ICD-10-CM | POA: Diagnosis not present

## 2021-05-03 HISTORY — DX: Hyperlipidemia, unspecified: E78.5

## 2021-05-03 HISTORY — DX: Essential (primary) hypertension: I10

## 2021-05-03 HISTORY — DX: Calculus of kidney: N20.0

## 2021-05-03 LAB — COMPREHENSIVE METABOLIC PANEL
ALT: 10 U/L (ref 0–44)
AST: 16 U/L (ref 15–41)
Albumin: 4 g/dL (ref 3.5–5.0)
Alkaline Phosphatase: 70 U/L (ref 38–126)
Anion gap: 9 (ref 5–15)
BUN: 21 mg/dL (ref 8–23)
CO2: 28 mmol/L (ref 22–32)
Calcium: 8.6 mg/dL — ABNORMAL LOW (ref 8.9–10.3)
Chloride: 101 mmol/L (ref 98–111)
Creatinine, Ser: 1 mg/dL (ref 0.61–1.24)
GFR, Estimated: >60 mL/min (ref >60)
Glucose, Bld: 105 mg/dL — ABNORMAL HIGH (ref 70–99)
Potassium: 3.7 mmol/L (ref 3.5–5.1)
Sodium: 138 mmol/L (ref 135–145)
Total Bilirubin: 0.5 mg/dL (ref 0.3–1.2)
Total Protein: 7.6 g/dL (ref 6.5–8.1)

## 2021-05-03 LAB — CBC WITH DIFFERENTIAL/PLATELET
Abs Immature Granulocytes: 0.02 10*3/uL (ref 0.00–0.07)
Basophils Absolute: 0 10*3/uL (ref 0.0–0.1)
Basophils Relative: 0 %
Eosinophils Absolute: 0.1 10*3/uL (ref 0.0–0.5)
Eosinophils Relative: 2 %
HCT: 39.6 % (ref 39.0–52.0)
Hemoglobin: 13.4 g/dL (ref 13.0–17.0)
Immature Granulocytes: 0 %
Lymphocytes Relative: 13 %
Lymphs Abs: 0.9 10*3/uL (ref 0.7–4.0)
MCH: 32.4 pg (ref 26.0–34.0)
MCHC: 33.8 g/dL (ref 30.0–36.0)
MCV: 95.9 fL (ref 80.0–100.0)
Monocytes Absolute: 0.6 10*3/uL (ref 0.1–1.0)
Monocytes Relative: 9 %
Neutro Abs: 5.5 10*3/uL (ref 1.7–7.7)
Neutrophils Relative %: 76 %
Platelets: 241 10*3/uL (ref 150–400)
RBC: 4.13 MIL/uL — ABNORMAL LOW (ref 4.22–5.81)
RDW: 12.7 % (ref 11.5–15.5)
WBC: 7.2 10*3/uL (ref 4.0–10.5)
nRBC: 0 % (ref 0.0–0.2)

## 2021-05-03 LAB — RESP PANEL BY RT-PCR (FLU A&B, COVID) ARPGX2
Influenza A by PCR: NEGATIVE
Influenza B by PCR: NEGATIVE
SARS Coronavirus 2 by RT PCR: POSITIVE — AB

## 2021-05-03 MED ORDER — CARESTART COVID-19 HOME TEST VI KIT
PACK | 0 refills | Status: DC
  Filled 2021-05-03: qty 4, 30d supply, fill #0

## 2021-05-03 NOTE — Discharge Instructions (Addendum)
We will follow up on your results and call in medication for you should you test positive for COVID 19  Follow up with your PCP regarding ED visit

## 2021-05-03 NOTE — ED Triage Notes (Signed)
Covid + with covid SX x 1 day , cough , fever , fatigue

## 2021-05-03 NOTE — ED Provider Notes (Signed)
[facility] Provider Note   CSN: [id number] Arrival date & time: 05/03/21  1802     History Chief Complaint  Patient presents with   Covid Positive    Drew Downs is a 75 y.o. male with Pmhx HTN and HLD who presents to the ED today with complaint of COVID symptoms.  Reports he had a friend over yesterday who was coughing the entire time he was there.  Patient states that a few hours later he began coughing and feeling ill with subjective fevers and chills.  Patient states that his friend came over and gave him an at home COVID test today and he was told "it was positive" however he is unsure as he did not see the results.  He states he wants a definitive COVID test at this time.  Patient also reports that his brother was here a couple of months ago and was prescribed oral antiviral medication which she is interested in today.  He denies any shortness of breath.  He has no other complaints at this time.  The history is provided by the patient and medical records.      Past Medical History:  Diagnosis Date   HTN (hypertension)    Hyperlipidemia    Kidney stone     There are no problems to display for this patient.   Past Surgical History:  Procedure Laterality Date   EYE SURGERY         History reviewed. No pertinent family history.  Social History   Tobacco Use   Smoking status: Former    Types: Cigarettes   Smokeless tobacco: Current  Substance Use Topics   Alcohol use: Not Currently   Drug use: Not Currently    Home Medications Prior to Admission medications   Not on File    Allergies    Patient has no known allergies.  Review of Systems   Review of Systems  Constitutional:  Positive for chills and fever (subjective).  Respiratory:  Positive for cough. Negative for shortness of breath.   All other systems reviewed and are negative.  Physical Exam Updated Vital Signs BP (!) 172/107 (BP Location: Right Arm)   Pulse  85   Temp 98.8 F (37.1 C) (Oral)   Resp 16   Ht '5\' 10"'$  (1.778 m)   Wt 93 kg   SpO2 94%   BMI 29.41 kg/m   Physical Exam Vitals and nursing note reviewed.  Constitutional:      Appearance: He is not ill-appearing or diaphoretic.  HENT:     Head: Normocephalic and atraumatic.  Eyes:     Conjunctiva/sclera: Conjunctivae normal.  Cardiovascular:     Rate and Rhythm: Normal rate and regular rhythm.     Pulses: Normal pulses.  Pulmonary:     Effort: Pulmonary effort is normal.     Breath sounds: Normal breath sounds. No wheezing, rhonchi or rales.     Comments: Speaking in full sentences without difficulty. Satting 94% on RA. LCTAB Abdominal:     Palpations: Abdomen is soft.     Tenderness: There is no abdominal tenderness.  Musculoskeletal:     Cervical back: Neck supple.  Skin:    General: Skin is warm and dry.     Coloration: Skin is not jaundiced.  Neurological:     Mental Status: He is alert.    ED Results / Procedures / Treatments   Labs (all labs ordered are listed, but only abnormal results are displayed) Labs  Reviewed  CBC WITH DIFFERENTIAL/PLATELET - Abnormal; Notable for the following components:      Result Value   RBC 4.13 (*)    All other components within normal limits  RESP PANEL BY RT-PCR (FLU A&B, COVID) [id number]  COMPREHENSIVE METABOLIC PANEL    EKG None  Radiology No results found.  Procedures Procedures   Medications Ordered in ED Medications - No data to display  ED Course  I have reviewed the triage vital signs and the nursing notes.  Pertinent labs & imaging results that were available during my care of the patient were reviewed by me and considered in my medical decision making (see chart for details).    MDM Rules/Calculators/A&P                           75 year old male who presents to the ED today with concern for COVID symptoms.  He reports that he took an at home COVID test and was told it was positive however he is unsure  and wants a definitive test today.  He is vaccinated x3.  On arrival to the ED vitals are stable.  Patient is afebrile, nontachycardic and nontachypneic and appears to be in no acute distress.  He is actively coughing however his lungs are clear to auscultation bilaterally. Non hypoxic. He is speaking in full sentences without difficulty.  Overall well-appearing.  We will plan for COVID test at this time.  Given age we will plan for labs to assess for kidney function to determine if he is eligible for Paxlovid.   Attending physician Dr. Pearline Cables evaluated patient and recommends discharging prior to [place] and labs returning. Will follow up on same and Rx paxlovid.   Final Clinical Impression(s) / ED Diagnoses Final diagnoses:  Person under investigation for COVID-19    Rx / DC Orders ED Discharge Orders     None        Eustaquio Maize, PA-C [id number] [phone]    Campbell Stall P, DO [id number]

## 2021-05-04 ENCOUNTER — Telehealth (HOSPITAL_BASED_OUTPATIENT_CLINIC_OR_DEPARTMENT_OTHER): Payer: Self-pay | Admitting: Emergency Medicine

## 2021-05-04 MED ORDER — NIRMATRELVIR/RITONAVIR (PAXLOVID)TABLET: 3.0000 | ORAL_TABLET | Freq: Two times a day (BID) | ORAL | 0 refills | Status: AC

## 2021-05-04 NOTE — Telephone Encounter (Signed)
75 year old male who was noted to be COVID-positive. Called today for prescription. Was pending labs and Paxlovid prescription which never got sent.  Risks and side effects of this medication discussed with the patient.  His kidney function is normal with a GFR greater than 60.  Patient is within the window to start this therapy.  He prefers to have the prescription sent and will follow up with his primary doctor for repeat blood work and follow-up.  Prescription was sent.

## 2021-05-04 NOTE — ED Provider Notes (Signed)
   I called patient to notify of positive Covid 19 virus PCR. Pt already notified and Paxlovid sent to pharmacy. Remainder of questions answered.   Campbell Stall, D.O.  [id number]   Lianne Cure, DO [id number]

## 2021-08-30 DIAGNOSIS — E782 Mixed hyperlipidemia: Secondary | ICD-10-CM | POA: Diagnosis not present

## 2021-08-30 DIAGNOSIS — I7090 Unspecified atherosclerosis: Secondary | ICD-10-CM | POA: Diagnosis not present

## 2021-08-30 DIAGNOSIS — Z125 Encounter for screening for malignant neoplasm of prostate: Secondary | ICD-10-CM | POA: Diagnosis not present

## 2021-08-30 DIAGNOSIS — I10 Essential (primary) hypertension: Secondary | ICD-10-CM | POA: Diagnosis not present

## 2021-11-02 DIAGNOSIS — D1801 Hemangioma of skin and subcutaneous tissue: Secondary | ICD-10-CM | POA: Diagnosis not present

## 2021-11-02 DIAGNOSIS — D692 Other nonthrombocytopenic purpura: Secondary | ICD-10-CM | POA: Diagnosis not present

## 2021-11-02 DIAGNOSIS — L570 Actinic keratosis: Secondary | ICD-10-CM | POA: Diagnosis not present

## 2021-11-02 DIAGNOSIS — Z85828 Personal history of other malignant neoplasm of skin: Secondary | ICD-10-CM | POA: Diagnosis not present

## 2021-11-02 DIAGNOSIS — D225 Melanocytic nevi of trunk: Secondary | ICD-10-CM | POA: Diagnosis not present

## 2021-11-02 DIAGNOSIS — D2371 Other benign neoplasm of skin of right lower limb, including hip: Secondary | ICD-10-CM | POA: Diagnosis not present

## 2021-11-02 DIAGNOSIS — Z86008 Personal history of in-situ neoplasm of other site: Secondary | ICD-10-CM | POA: Diagnosis not present

## 2021-11-02 DIAGNOSIS — Z85820 Personal history of malignant melanoma of skin: Secondary | ICD-10-CM | POA: Diagnosis not present

## 2021-11-02 DIAGNOSIS — L821 Other seborrheic keratosis: Secondary | ICD-10-CM | POA: Diagnosis not present

## 2022-02-05 DIAGNOSIS — R69 Illness, unspecified: Secondary | ICD-10-CM | POA: Diagnosis not present

## 2022-03-01 DIAGNOSIS — I10 Essential (primary) hypertension: Secondary | ICD-10-CM | POA: Diagnosis not present

## 2022-03-01 DIAGNOSIS — E782 Mixed hyperlipidemia: Secondary | ICD-10-CM | POA: Diagnosis not present

## 2022-04-05 DIAGNOSIS — Z961 Presence of intraocular lens: Secondary | ICD-10-CM | POA: Diagnosis not present

## 2022-04-05 DIAGNOSIS — H26491 Other secondary cataract, right eye: Secondary | ICD-10-CM | POA: Diagnosis not present

## 2022-04-05 DIAGNOSIS — H18413 Arcus senilis, bilateral: Secondary | ICD-10-CM | POA: Diagnosis not present

## 2022-04-05 DIAGNOSIS — H353131 Nonexudative age-related macular degeneration, bilateral, early dry stage: Secondary | ICD-10-CM | POA: Diagnosis not present

## 2022-05-10 DIAGNOSIS — Z85820 Personal history of malignant melanoma of skin: Secondary | ICD-10-CM | POA: Diagnosis not present

## 2022-05-10 DIAGNOSIS — L821 Other seborrheic keratosis: Secondary | ICD-10-CM | POA: Diagnosis not present

## 2022-05-10 DIAGNOSIS — D235 Other benign neoplasm of skin of trunk: Secondary | ICD-10-CM | POA: Diagnosis not present

## 2022-05-10 DIAGNOSIS — Z85828 Personal history of other malignant neoplasm of skin: Secondary | ICD-10-CM | POA: Diagnosis not present

## 2022-05-10 DIAGNOSIS — L570 Actinic keratosis: Secondary | ICD-10-CM | POA: Diagnosis not present

## 2022-08-10 DIAGNOSIS — Z23 Encounter for immunization: Secondary | ICD-10-CM | POA: Diagnosis not present

## 2022-08-26 DIAGNOSIS — R051 Acute cough: Secondary | ICD-10-CM | POA: Diagnosis not present

## 2022-09-13 DIAGNOSIS — J40 Bronchitis, not specified as acute or chronic: Secondary | ICD-10-CM | POA: Diagnosis not present

## 2022-09-13 DIAGNOSIS — E782 Mixed hyperlipidemia: Secondary | ICD-10-CM | POA: Diagnosis not present

## 2022-09-13 DIAGNOSIS — I10 Essential (primary) hypertension: Secondary | ICD-10-CM | POA: Diagnosis not present

## 2022-11-10 DIAGNOSIS — D485 Neoplasm of uncertain behavior of skin: Secondary | ICD-10-CM | POA: Diagnosis not present

## 2022-11-10 DIAGNOSIS — L821 Other seborrheic keratosis: Secondary | ICD-10-CM | POA: Diagnosis not present

## 2022-11-10 DIAGNOSIS — D235 Other benign neoplasm of skin of trunk: Secondary | ICD-10-CM | POA: Diagnosis not present

## 2022-11-10 DIAGNOSIS — Z85828 Personal history of other malignant neoplasm of skin: Secondary | ICD-10-CM | POA: Diagnosis not present

## 2022-11-10 DIAGNOSIS — L820 Inflamed seborrheic keratosis: Secondary | ICD-10-CM | POA: Diagnosis not present

## 2022-11-10 DIAGNOSIS — Z85820 Personal history of malignant melanoma of skin: Secondary | ICD-10-CM | POA: Diagnosis not present

## 2022-11-10 DIAGNOSIS — L570 Actinic keratosis: Secondary | ICD-10-CM | POA: Diagnosis not present

## 2022-11-11 DIAGNOSIS — Z125 Encounter for screening for malignant neoplasm of prostate: Secondary | ICD-10-CM | POA: Diagnosis not present

## 2022-11-11 DIAGNOSIS — N401 Enlarged prostate with lower urinary tract symptoms: Secondary | ICD-10-CM | POA: Diagnosis not present

## 2022-11-11 DIAGNOSIS — N486 Induration penis plastica: Secondary | ICD-10-CM | POA: Diagnosis not present

## 2022-11-11 DIAGNOSIS — R3912 Poor urinary stream: Secondary | ICD-10-CM | POA: Diagnosis not present

## 2023-03-07 DIAGNOSIS — E782 Mixed hyperlipidemia: Secondary | ICD-10-CM | POA: Diagnosis not present

## 2023-03-07 DIAGNOSIS — I10 Essential (primary) hypertension: Secondary | ICD-10-CM | POA: Diagnosis not present

## 2023-04-14 DIAGNOSIS — H18413 Arcus senilis, bilateral: Secondary | ICD-10-CM | POA: Diagnosis not present

## 2023-04-14 DIAGNOSIS — Z961 Presence of intraocular lens: Secondary | ICD-10-CM | POA: Diagnosis not present

## 2023-04-14 DIAGNOSIS — H353131 Nonexudative age-related macular degeneration, bilateral, early dry stage: Secondary | ICD-10-CM | POA: Diagnosis not present

## 2023-04-14 DIAGNOSIS — H26493 Other secondary cataract, bilateral: Secondary | ICD-10-CM | POA: Diagnosis not present

## 2023-05-15 DIAGNOSIS — L570 Actinic keratosis: Secondary | ICD-10-CM | POA: Diagnosis not present

## 2023-05-15 DIAGNOSIS — Z129 Encounter for screening for malignant neoplasm, site unspecified: Secondary | ICD-10-CM | POA: Diagnosis not present

## 2023-05-15 DIAGNOSIS — Z85820 Personal history of malignant melanoma of skin: Secondary | ICD-10-CM | POA: Diagnosis not present

## 2023-05-15 DIAGNOSIS — L821 Other seborrheic keratosis: Secondary | ICD-10-CM | POA: Diagnosis not present

## 2023-05-15 DIAGNOSIS — L820 Inflamed seborrheic keratosis: Secondary | ICD-10-CM | POA: Diagnosis not present

## 2023-05-28 DIAGNOSIS — K5730 Diverticulosis of large intestine without perforation or abscess without bleeding: Secondary | ICD-10-CM | POA: Diagnosis not present

## 2023-05-28 DIAGNOSIS — Z87891 Personal history of nicotine dependence: Secondary | ICD-10-CM | POA: Diagnosis not present

## 2023-05-28 DIAGNOSIS — N201 Calculus of ureter: Secondary | ICD-10-CM | POA: Diagnosis not present

## 2023-05-28 DIAGNOSIS — N132 Hydronephrosis with renal and ureteral calculous obstruction: Secondary | ICD-10-CM | POA: Diagnosis not present

## 2023-05-28 DIAGNOSIS — Z79899 Other long term (current) drug therapy: Secondary | ICD-10-CM | POA: Diagnosis not present

## 2023-05-28 DIAGNOSIS — I10 Essential (primary) hypertension: Secondary | ICD-10-CM | POA: Diagnosis not present

## 2023-05-28 DIAGNOSIS — N3289 Other specified disorders of bladder: Secondary | ICD-10-CM | POA: Diagnosis not present

## 2023-05-28 DIAGNOSIS — K760 Fatty (change of) liver, not elsewhere classified: Secondary | ICD-10-CM | POA: Diagnosis not present

## 2023-05-29 DIAGNOSIS — N132 Hydronephrosis with renal and ureteral calculous obstruction: Secondary | ICD-10-CM | POA: Diagnosis not present

## 2023-05-29 DIAGNOSIS — N3289 Other specified disorders of bladder: Secondary | ICD-10-CM | POA: Diagnosis not present

## 2023-05-29 DIAGNOSIS — K5730 Diverticulosis of large intestine without perforation or abscess without bleeding: Secondary | ICD-10-CM | POA: Diagnosis not present

## 2023-05-29 DIAGNOSIS — K760 Fatty (change of) liver, not elsewhere classified: Secondary | ICD-10-CM | POA: Diagnosis not present

## 2023-05-30 DIAGNOSIS — N200 Calculus of kidney: Secondary | ICD-10-CM | POA: Diagnosis not present

## 2023-05-30 DIAGNOSIS — K5900 Constipation, unspecified: Secondary | ICD-10-CM | POA: Diagnosis not present

## 2023-05-30 DIAGNOSIS — N401 Enlarged prostate with lower urinary tract symptoms: Secondary | ICD-10-CM | POA: Diagnosis not present

## 2023-05-30 DIAGNOSIS — R3912 Poor urinary stream: Secondary | ICD-10-CM | POA: Diagnosis not present

## 2023-05-30 DIAGNOSIS — Z1330 Encounter for screening examination for mental health and behavioral disorders, unspecified: Secondary | ICD-10-CM | POA: Diagnosis not present

## 2023-05-30 DIAGNOSIS — N201 Calculus of ureter: Secondary | ICD-10-CM | POA: Diagnosis not present

## 2023-05-31 DIAGNOSIS — N132 Hydronephrosis with renal and ureteral calculous obstruction: Secondary | ICD-10-CM | POA: Diagnosis not present

## 2023-05-31 DIAGNOSIS — I10 Essential (primary) hypertension: Secondary | ICD-10-CM | POA: Diagnosis not present

## 2023-05-31 DIAGNOSIS — R9341 Abnormal radiologic findings on diagnostic imaging of renal pelvis, ureter, or bladder: Secondary | ICD-10-CM | POA: Diagnosis not present

## 2023-05-31 DIAGNOSIS — Z960 Presence of urogenital implants: Secondary | ICD-10-CM | POA: Diagnosis not present

## 2023-05-31 DIAGNOSIS — Z466 Encounter for fitting and adjustment of urinary device: Secondary | ICD-10-CM | POA: Diagnosis not present

## 2023-05-31 DIAGNOSIS — F17220 Nicotine dependence, chewing tobacco, uncomplicated: Secondary | ICD-10-CM | POA: Diagnosis not present

## 2023-05-31 DIAGNOSIS — N201 Calculus of ureter: Secondary | ICD-10-CM | POA: Diagnosis not present

## 2023-05-31 DIAGNOSIS — Z79899 Other long term (current) drug therapy: Secondary | ICD-10-CM | POA: Diagnosis not present

## 2023-05-31 DIAGNOSIS — K219 Gastro-esophageal reflux disease without esophagitis: Secondary | ICD-10-CM | POA: Diagnosis not present

## 2023-06-01 DIAGNOSIS — N132 Hydronephrosis with renal and ureteral calculous obstruction: Secondary | ICD-10-CM | POA: Diagnosis not present

## 2023-06-01 DIAGNOSIS — I10 Essential (primary) hypertension: Secondary | ICD-10-CM | POA: Diagnosis not present

## 2023-06-01 DIAGNOSIS — F17220 Nicotine dependence, chewing tobacco, uncomplicated: Secondary | ICD-10-CM | POA: Diagnosis not present

## 2023-06-01 DIAGNOSIS — Z79899 Other long term (current) drug therapy: Secondary | ICD-10-CM | POA: Diagnosis not present

## 2023-06-06 ENCOUNTER — Encounter: Payer: Medicare HMO | Admitting: Urology

## 2023-06-26 DIAGNOSIS — N400 Enlarged prostate without lower urinary tract symptoms: Secondary | ICD-10-CM | POA: Diagnosis not present

## 2023-06-26 DIAGNOSIS — K5750 Diverticulosis of both small and large intestine without perforation or abscess without bleeding: Secondary | ICD-10-CM | POA: Diagnosis not present

## 2023-06-26 DIAGNOSIS — N390 Urinary tract infection, site not specified: Secondary | ICD-10-CM | POA: Diagnosis not present

## 2023-06-26 DIAGNOSIS — N202 Calculus of kidney with calculus of ureter: Secondary | ICD-10-CM | POA: Diagnosis not present

## 2023-06-26 DIAGNOSIS — R1031 Right lower quadrant pain: Secondary | ICD-10-CM | POA: Diagnosis not present

## 2023-06-26 DIAGNOSIS — N3289 Other specified disorders of bladder: Secondary | ICD-10-CM | POA: Diagnosis not present

## 2023-06-26 DIAGNOSIS — N200 Calculus of kidney: Secondary | ICD-10-CM | POA: Diagnosis not present

## 2023-06-26 DIAGNOSIS — Z960 Presence of urogenital implants: Secondary | ICD-10-CM | POA: Diagnosis not present

## 2023-06-26 DIAGNOSIS — Z87891 Personal history of nicotine dependence: Secondary | ICD-10-CM | POA: Diagnosis not present

## 2023-06-26 DIAGNOSIS — N179 Acute kidney failure, unspecified: Secondary | ICD-10-CM | POA: Diagnosis not present

## 2023-06-26 DIAGNOSIS — R1011 Right upper quadrant pain: Secondary | ICD-10-CM | POA: Diagnosis not present

## 2023-06-26 DIAGNOSIS — R509 Fever, unspecified: Secondary | ICD-10-CM | POA: Diagnosis not present

## 2023-06-28 DIAGNOSIS — R3912 Poor urinary stream: Secondary | ICD-10-CM | POA: Diagnosis not present

## 2023-06-28 DIAGNOSIS — N201 Calculus of ureter: Secondary | ICD-10-CM | POA: Diagnosis not present

## 2023-06-28 DIAGNOSIS — N529 Male erectile dysfunction, unspecified: Secondary | ICD-10-CM | POA: Diagnosis not present

## 2023-06-28 DIAGNOSIS — N401 Enlarged prostate with lower urinary tract symptoms: Secondary | ICD-10-CM | POA: Diagnosis not present

## 2023-07-05 DIAGNOSIS — Z79899 Other long term (current) drug therapy: Secondary | ICD-10-CM | POA: Diagnosis not present

## 2023-07-05 DIAGNOSIS — Z823 Family history of stroke: Secondary | ICD-10-CM | POA: Diagnosis not present

## 2023-07-05 DIAGNOSIS — Z466 Encounter for fitting and adjustment of urinary device: Secondary | ICD-10-CM | POA: Diagnosis not present

## 2023-07-05 DIAGNOSIS — I10 Essential (primary) hypertension: Secondary | ICD-10-CM | POA: Diagnosis not present

## 2023-07-05 DIAGNOSIS — Z8249 Family history of ischemic heart disease and other diseases of the circulatory system: Secondary | ICD-10-CM | POA: Diagnosis not present

## 2023-07-05 DIAGNOSIS — N201 Calculus of ureter: Secondary | ICD-10-CM | POA: Diagnosis not present

## 2023-07-05 DIAGNOSIS — K219 Gastro-esophageal reflux disease without esophagitis: Secondary | ICD-10-CM | POA: Diagnosis not present

## 2023-07-05 DIAGNOSIS — E785 Hyperlipidemia, unspecified: Secondary | ICD-10-CM | POA: Diagnosis not present

## 2023-07-05 DIAGNOSIS — N200 Calculus of kidney: Secondary | ICD-10-CM | POA: Diagnosis not present

## 2023-07-11 DIAGNOSIS — Z23 Encounter for immunization: Secondary | ICD-10-CM | POA: Diagnosis not present

## 2023-07-24 DIAGNOSIS — R3912 Poor urinary stream: Secondary | ICD-10-CM | POA: Diagnosis not present

## 2023-07-24 DIAGNOSIS — N529 Male erectile dysfunction, unspecified: Secondary | ICD-10-CM | POA: Diagnosis not present

## 2023-07-24 DIAGNOSIS — N401 Enlarged prostate with lower urinary tract symptoms: Secondary | ICD-10-CM | POA: Diagnosis not present

## 2023-07-24 DIAGNOSIS — Z960 Presence of urogenital implants: Secondary | ICD-10-CM | POA: Diagnosis not present

## 2023-07-24 DIAGNOSIS — N200 Calculus of kidney: Secondary | ICD-10-CM | POA: Diagnosis not present

## 2023-07-24 DIAGNOSIS — N201 Calculus of ureter: Secondary | ICD-10-CM | POA: Diagnosis not present

## 2023-07-24 DIAGNOSIS — Z87442 Personal history of urinary calculi: Secondary | ICD-10-CM | POA: Diagnosis not present

## 2023-08-08 DIAGNOSIS — Z0000 Encounter for general adult medical examination without abnormal findings: Secondary | ICD-10-CM | POA: Diagnosis not present

## 2023-08-08 DIAGNOSIS — R7301 Impaired fasting glucose: Secondary | ICD-10-CM | POA: Diagnosis not present

## 2023-08-08 DIAGNOSIS — D136 Benign neoplasm of pancreas: Secondary | ICD-10-CM | POA: Diagnosis not present

## 2023-08-08 DIAGNOSIS — Z23 Encounter for immunization: Secondary | ICD-10-CM | POA: Diagnosis not present

## 2023-08-08 DIAGNOSIS — I10 Essential (primary) hypertension: Secondary | ICD-10-CM | POA: Diagnosis not present

## 2023-08-08 DIAGNOSIS — E782 Mixed hyperlipidemia: Secondary | ICD-10-CM | POA: Diagnosis not present

## 2023-08-08 DIAGNOSIS — Z1211 Encounter for screening for malignant neoplasm of colon: Secondary | ICD-10-CM | POA: Diagnosis not present

## 2023-08-08 DIAGNOSIS — D369 Benign neoplasm, unspecified site: Secondary | ICD-10-CM | POA: Diagnosis not present

## 2023-08-08 DIAGNOSIS — Z87442 Personal history of urinary calculi: Secondary | ICD-10-CM | POA: Diagnosis not present

## 2023-08-08 DIAGNOSIS — Z85820 Personal history of malignant melanoma of skin: Secondary | ICD-10-CM | POA: Diagnosis not present

## 2023-09-01 DIAGNOSIS — K5750 Diverticulosis of both small and large intestine without perforation or abscess without bleeding: Secondary | ICD-10-CM | POA: Diagnosis not present

## 2023-09-01 DIAGNOSIS — D136 Benign neoplasm of pancreas: Secondary | ICD-10-CM | POA: Diagnosis not present

## 2023-09-01 DIAGNOSIS — K5900 Constipation, unspecified: Secondary | ICD-10-CM | POA: Diagnosis not present

## 2023-09-01 DIAGNOSIS — N281 Cyst of kidney, acquired: Secondary | ICD-10-CM | POA: Diagnosis not present

## 2023-10-23 DIAGNOSIS — N281 Cyst of kidney, acquired: Secondary | ICD-10-CM | POA: Diagnosis not present

## 2023-10-23 DIAGNOSIS — N200 Calculus of kidney: Secondary | ICD-10-CM | POA: Diagnosis not present

## 2023-10-23 DIAGNOSIS — Z87442 Personal history of urinary calculi: Secondary | ICD-10-CM | POA: Diagnosis not present

## 2023-11-20 DIAGNOSIS — Z129 Encounter for screening for malignant neoplasm, site unspecified: Secondary | ICD-10-CM | POA: Diagnosis not present

## 2023-11-20 DIAGNOSIS — L570 Actinic keratosis: Secondary | ICD-10-CM | POA: Diagnosis not present

## 2023-11-20 DIAGNOSIS — L821 Other seborrheic keratosis: Secondary | ICD-10-CM | POA: Diagnosis not present

## 2023-11-20 DIAGNOSIS — Z85820 Personal history of malignant melanoma of skin: Secondary | ICD-10-CM | POA: Diagnosis not present

## 2023-11-20 DIAGNOSIS — Z85828 Personal history of other malignant neoplasm of skin: Secondary | ICD-10-CM | POA: Diagnosis not present

## 2023-11-20 DIAGNOSIS — L2089 Other atopic dermatitis: Secondary | ICD-10-CM | POA: Diagnosis not present
